# Patient Record
Sex: Male | Born: 1960 | Race: Black or African American | Hispanic: No | Marital: Single | Smoking: Current every day smoker
Health system: Southern US, Community
[De-identification: ages and names within clinical notes are randomized; demographics above are authoritative.]

## PROBLEM LIST (undated history)

## (undated) HISTORY — PX: BACK SURGERY: SHX140

---

## 2019-10-13 DIAGNOSIS — L0591 Pilonidal cyst without abscess: Secondary | ICD-10-CM | POA: Insufficient documentation

## 2019-10-13 HISTORY — DX: Pilonidal cyst without abscess: L05.91

## 2019-11-09 DIAGNOSIS — Z09 Encounter for follow-up examination after completed treatment for conditions other than malignant neoplasm: Secondary | ICD-10-CM

## 2019-11-09 HISTORY — DX: Encounter for follow-up examination after completed treatment for conditions other than malignant neoplasm: Z09

## 2019-12-02 DIAGNOSIS — E782 Mixed hyperlipidemia: Secondary | ICD-10-CM

## 2019-12-02 DIAGNOSIS — I10 Essential (primary) hypertension: Secondary | ICD-10-CM

## 2019-12-02 DIAGNOSIS — E119 Type 2 diabetes mellitus without complications: Secondary | ICD-10-CM | POA: Insufficient documentation

## 2019-12-02 HISTORY — DX: Mixed hyperlipidemia: E78.2

## 2019-12-02 HISTORY — DX: Essential (primary) hypertension: I10

## 2019-12-02 HISTORY — DX: Type 2 diabetes mellitus without complications: E11.9

## 2019-12-05 DIAGNOSIS — N401 Enlarged prostate with lower urinary tract symptoms: Secondary | ICD-10-CM

## 2019-12-05 DIAGNOSIS — F339 Major depressive disorder, recurrent, unspecified: Secondary | ICD-10-CM

## 2019-12-05 DIAGNOSIS — J45990 Exercise induced bronchospasm: Secondary | ICD-10-CM | POA: Insufficient documentation

## 2019-12-05 HISTORY — DX: Major depressive disorder, recurrent, unspecified: F33.9

## 2019-12-05 HISTORY — DX: Exercise induced bronchospasm: J45.990

## 2019-12-05 HISTORY — DX: Benign prostatic hyperplasia with lower urinary tract symptoms: N40.1

## 2020-01-06 DIAGNOSIS — G43909 Migraine, unspecified, not intractable, without status migrainosus: Secondary | ICD-10-CM | POA: Insufficient documentation

## 2020-01-06 DIAGNOSIS — H903 Sensorineural hearing loss, bilateral: Secondary | ICD-10-CM | POA: Insufficient documentation

## 2020-01-06 DIAGNOSIS — R972 Elevated prostate specific antigen [PSA]: Secondary | ICD-10-CM | POA: Insufficient documentation

## 2020-01-06 DIAGNOSIS — A630 Anogenital (venereal) warts: Secondary | ICD-10-CM | POA: Insufficient documentation

## 2020-01-06 DIAGNOSIS — Z8659 Personal history of other mental and behavioral disorders: Secondary | ICD-10-CM | POA: Insufficient documentation

## 2020-01-06 HISTORY — DX: Anogenital (venereal) warts: A63.0

## 2020-01-06 HISTORY — DX: Migraine, unspecified, not intractable, without status migrainosus: G43.909

## 2020-01-06 HISTORY — DX: Sensorineural hearing loss, bilateral: H90.3

## 2020-01-06 HISTORY — DX: Elevated prostate specific antigen (PSA): R97.20

## 2020-03-02 DIAGNOSIS — J039 Acute tonsillitis, unspecified: Secondary | ICD-10-CM | POA: Insufficient documentation

## 2020-03-02 HISTORY — DX: Acute tonsillitis, unspecified: J03.90

## 2020-05-11 DIAGNOSIS — M47812 Spondylosis without myelopathy or radiculopathy, cervical region: Secondary | ICD-10-CM | POA: Insufficient documentation

## 2020-05-11 DIAGNOSIS — R29898 Other symptoms and signs involving the musculoskeletal system: Secondary | ICD-10-CM | POA: Insufficient documentation

## 2020-05-11 DIAGNOSIS — G5602 Carpal tunnel syndrome, left upper limb: Secondary | ICD-10-CM

## 2020-05-11 DIAGNOSIS — M5030 Other cervical disc degeneration, unspecified cervical region: Secondary | ICD-10-CM | POA: Insufficient documentation

## 2020-05-11 DIAGNOSIS — G5621 Lesion of ulnar nerve, right upper limb: Secondary | ICD-10-CM

## 2020-05-11 HISTORY — DX: Lesion of ulnar nerve, right upper limb: G56.21

## 2020-05-11 HISTORY — DX: Carpal tunnel syndrome, left upper limb: G56.02

## 2020-05-11 HISTORY — DX: Spondylosis without myelopathy or radiculopathy, cervical region: M47.812

## 2020-05-11 HISTORY — DX: Other symptoms and signs involving the musculoskeletal system: R29.898

## 2020-05-11 HISTORY — DX: Other cervical disc degeneration, unspecified cervical region: M50.30

## 2020-05-24 DIAGNOSIS — R079 Chest pain, unspecified: Secondary | ICD-10-CM | POA: Diagnosis not present

## 2020-05-25 DIAGNOSIS — E785 Hyperlipidemia, unspecified: Secondary | ICD-10-CM

## 2020-05-25 DIAGNOSIS — R9431 Abnormal electrocardiogram [ECG] [EKG]: Secondary | ICD-10-CM

## 2020-05-25 DIAGNOSIS — F17200 Nicotine dependence, unspecified, uncomplicated: Secondary | ICD-10-CM | POA: Diagnosis not present

## 2020-05-25 DIAGNOSIS — I10 Essential (primary) hypertension: Secondary | ICD-10-CM

## 2020-05-25 DIAGNOSIS — R079 Chest pain, unspecified: Secondary | ICD-10-CM | POA: Diagnosis not present

## 2020-05-26 ENCOUNTER — Encounter: Payer: Self-pay | Admitting: Cardiology

## 2020-05-26 DIAGNOSIS — F17200 Nicotine dependence, unspecified, uncomplicated: Secondary | ICD-10-CM

## 2020-05-26 DIAGNOSIS — R079 Chest pain, unspecified: Secondary | ICD-10-CM | POA: Diagnosis not present

## 2020-05-26 DIAGNOSIS — I10 Essential (primary) hypertension: Secondary | ICD-10-CM | POA: Diagnosis not present

## 2020-05-26 DIAGNOSIS — E785 Hyperlipidemia, unspecified: Secondary | ICD-10-CM

## 2020-05-30 ENCOUNTER — Ambulatory Visit: Payer: Self-pay | Admitting: Cardiology

## 2020-06-15 DIAGNOSIS — R399 Unspecified symptoms and signs involving the genitourinary system: Secondary | ICD-10-CM | POA: Insufficient documentation

## 2020-06-15 HISTORY — DX: Unspecified symptoms and signs involving the genitourinary system: R39.9

## 2020-06-25 ENCOUNTER — Ambulatory Visit (INDEPENDENT_AMBULATORY_CARE_PROVIDER_SITE_OTHER): Payer: Medicaid Other | Admitting: Cardiology

## 2020-06-25 ENCOUNTER — Encounter: Payer: Self-pay | Admitting: Cardiology

## 2020-06-25 ENCOUNTER — Other Ambulatory Visit: Payer: Self-pay

## 2020-06-25 VITALS — BP 134/80 | HR 89 | Ht 72.0 in | Wt 239.8 lb

## 2020-06-25 DIAGNOSIS — E119 Type 2 diabetes mellitus without complications: Secondary | ICD-10-CM

## 2020-06-25 DIAGNOSIS — R072 Precordial pain: Secondary | ICD-10-CM | POA: Diagnosis not present

## 2020-06-25 DIAGNOSIS — E782 Mixed hyperlipidemia: Secondary | ICD-10-CM

## 2020-06-25 DIAGNOSIS — E669 Obesity, unspecified: Secondary | ICD-10-CM

## 2020-06-25 DIAGNOSIS — I10 Essential (primary) hypertension: Secondary | ICD-10-CM

## 2020-06-25 HISTORY — DX: Precordial pain: R07.2

## 2020-06-25 HISTORY — DX: Obesity, unspecified: E66.9

## 2020-06-25 MED ORDER — METOPROLOL TARTRATE 100 MG PO TABS: 100.0000 mg | ORAL_TABLET | Freq: Once | ORAL | 0 refills | Status: AC

## 2020-06-25 NOTE — Progress Notes (Signed)
Cardiology Office Note:    Date:  06/25/2020   ID:  David Mason, DOB 22-May-1961, MRN [id number]  PCP:  Algis Greenhouse, MD  Cardiologist:  Berniece Salines, DO  Electrophysiologist:  None   Referring MD: Algis Greenhouse, MD   Hospital follow-up  History of Present Illness:    David Mason is a 59 y.o. male with a hx of *hypertension, diabetes, hyperlipidemia presents today to be evaluated for chest pain.  Patient tells me that since his discharge from the hospital where he had a negative stress test he has still been experiencing intermittent chest pain.  He notes that this is getting worse.  He described this as a midsternal chest discomfort which is sometimes occurs as a tightness.  Was bothering him is the worsening shortness of breath on exertion.  He notes that he feels short of breath when he is experiencing his pain.  Nothing makes it better or worse.  He is concerned.  History reviewed. No pertinent past medical history.  Past Surgical History:  Procedure Laterality Date  . BACK SURGERY      Current Medications: Current Meds  Medication Sig  . albuterol (VENTOLIN HFA) 108 (90 Base) MCG/ACT inhaler Inhale 2 puffs into the lungs every 6 (six) hours as needed.  Marland Kitchen amLODipine (NORVASC) 10 MG tablet Take 10 mg by mouth daily.  Marland Kitchen aspirin 81 MG EC tablet Take 81 mg by mouth daily.  Marland Kitchen atorvastatin (LIPITOR) 80 MG tablet Take 80 mg by mouth daily.  . DULoxetine (CYMBALTA) 60 MG capsule Take 60 mg by mouth 2 (two) times daily.  . DUREZOL 0.05 % EMUL Place 1 drop into the right eye in the morning.  Marland Kitchen FARXIGA 10 MG TABS tablet Take 10 mg by mouth daily.  . finasteride (PROSCAR) 5 MG tablet Take 5 mg by mouth daily.  . metFORMIN (GLUCOPHAGE) 1000 MG tablet Take 1,000 mg by mouth 2 (two) times daily.  . montelukast (SINGULAIR) 10 MG tablet Take 10 mg by mouth daily.  Marland Kitchen ofloxacin (OCUFLOX) 0.3 % ophthalmic solution Place 1 drop into the right eye 2 (two) times daily.  Marland Kitchen OLANZapine  (ZYPREXA) 5 MG tablet Take 5 mg by mouth at bedtime.  Marland Kitchen tiZANidine (ZANAFLEX) 4 MG capsule Take 4 mg by mouth every 6 (six) hours as needed.  . traZODone (DESYREL) 100 MG tablet Take 100 mg by mouth at bedtime.  . valsartan (DIOVAN) 320 MG tablet Take 320 mg by mouth daily.     Allergies:   Patient has no known allergies.   Social History   Socioeconomic History  . Marital status: Single    Spouse name: Not on file  . Number of children: Not on file  . Years of education: Not on file  . Highest education level: Not on file  Occupational History  . Not on file  Tobacco Use  . Smoking status: Not on file  Substance and Sexual Activity  . Alcohol use: Not on file  . Drug use: Not on file  . Sexual activity: Not on file  Other Topics Concern  . Not on file  Social History Narrative  . Not on file   Social Determinants of Health   Financial Resource Strain:   . Difficulty of Paying Living Expenses: Not on file  Food Insecurity:   . Worried About Charity fundraiser in the Last Year: Not on file  . Ran Out of Food in the Last Year: Not on file  Transportation  Needs:   . Lack of Transportation (Medical): Not on file  . Lack of Transportation (Non-Medical): Not on file  Physical Activity:   . Days of Exercise per Week: Not on file  . Minutes of Exercise per Session: Not on file  Stress:   . Feeling of Stress : Not on file  Social Connections:   . Frequency of Communication with Friends and Family: Not on file  . Frequency of Social Gatherings with Friends and Family: Not on file  . Attends Religious Services: Not on file  . Active Member of Clubs or Organizations: Not on file  . Attends Archivist Meetings: Not on file  . Marital Status: Not on file     Family History: The patient's family history includes Hypertension in his brother, brother, and brother.  ROS:   Review of Systems  Constitution: Negative for decreased appetite, fever and weight gain.    HENT: Negative for congestion, ear discharge, hoarse voice and sore throat.   Eyes: Negative for discharge, redness, vision loss in right eye and visual halos.  Cardiovascular: Reports chest pain, dyspnea on exertion.  Negative for leg swelling, orthopnea and palpitations.  Respiratory: Negative for cough, hemoptysis, shortness of breath and snoring.   Endocrine: Negative for heat intolerance and polyphagia.  Hematologic/Lymphatic: Negative for bleeding problem. Does not bruise/bleed easily.  Skin: Negative for flushing, nail changes, rash and suspicious lesions.  Musculoskeletal: Negative for arthritis, joint pain, muscle cramps, myalgias, neck pain and stiffness.  Gastrointestinal: Negative for abdominal pain, bowel incontinence, diarrhea and excessive appetite.  Genitourinary: Negative for decreased libido, genital sores and incomplete emptying.  Neurological: Negative for brief paralysis, focal weakness, headaches and loss of balance.  Psychiatric/Behavioral: Negative for altered mental status, depression and suicidal ideas.  Allergic/Immunologic: Negative for HIV exposure and persistent infections.    EKGs/Labs/Other Studies Reviewed:    The following studies were reviewed today:   EKG: None today  Stress test done at [facility] on May 26, 2020 shows no inducible ischemia or infarction.  Normal EF 56% low risk study.  Echocardiogram done showed mild left ventricle hypertrophy.  EF 55 to 60%.  Right ventricle is normal in size and function.  Left atrium is normal.  Right atrium is normal size and function.  Aortic valve is trileaflet.  There is trace mitral regurgitation.  Trace tricuspid regurgitation is present.  Pulmonic valve is normal.  Recent Labs: No results found for requested labs within last 8760 hours.  Recent Lipid Panel No results found for: CHOL, TRIG, HDL, CHOLHDL, VLDL, LDLCALC, LDLDIRECT  Physical Exam:    VS:  BP 134/80   Pulse 89   Ht 6' (1.829 m)    Wt 239 lb 12.8 oz (108.8 kg)   SpO2 94%   BMI 32.52 kg/m     Wt Readings from Last 3 Encounters:  06/25/20 239 lb 12.8 oz (108.8 kg)     GEN: Well nourished, well developed in no acute distress HEENT: Normal NECK: No JVD; No carotid bruits LYMPHATICS: No lymphadenopathy CARDIAC: S1S2 noted,RRR, no murmurs, rubs, gallops RESPIRATORY:  Clear to auscultation without rales, wheezing or rhonchi  ABDOMEN: Soft, non-tender, non-distended, +bowel sounds, no guarding. EXTREMITIES: No edema, No cyanosis, no clubbing MUSCULOSKELETAL:  No deformity  SKIN: Warm and dry NEUROLOGIC:  Alert and oriented x 3, non-focal PSYCHIATRIC:  Normal affect, good insight  ASSESSMENT:    1. Benign hypertension   2. Precordial pain   3. Type 2 diabetes mellitus without complication,  without long-term current use of insulin ([place])   4. Mixed hyperlipidemia   5. Obesity (BMI 30-39.9)    PLAN:     He still is experiencing chest pain which is concerning.  Despite the negative stress test I am concerned given his risk factors and I like to use a different diagnostic testing modality to test this patient for coronary artery disease.  A coronary CTA would be reasonable at this time.  I have educated patient about this testing he is agreeable to proceed with this testing.  His blood pressure deceptively in the office today no changes will be made to his antihypertensive medication. Diabetes mellitus per his PCP. Hyperlipidemia continue patient on his atorvastatin 80 mg a day. The patient understands the need to lose weight with diet and exercise. We have discussed specific strategies for this.  The patient is in agreement with the above plan. The patient left the office in stable condition.  The patient will follow up in 3 months or sooner if needed.   Medication Adjustments/Labs and Tests Ordered: Current medicines are reviewed at length with the patient today.  Concerns regarding medicines are outlined  above.  Orders Placed This Encounter  Procedures  . CT CORONARY MORPH W/CTA COR W/SCORE W/CA W/CM &/OR WO/CM  . CT CORONARY FRACTIONAL FLOW RESERVE DATA PREP  . CT CORONARY FRACTIONAL FLOW RESERVE FLUID ANALYSIS  . Basic Metabolic Panel (BMET)  . EKG 12-Lead   Meds ordered this encounter  Medications  . metoprolol tartrate (LOPRESSOR) 100 MG tablet    Sig: Take 1 tablet (100 mg total) by mouth once for 1 dose. Take 2 hours prior to procedure    Dispense:  1 tablet    Refill:  0    Patient Instructions  Medication Instructions:   Your physician recommends that you continue on your current medications as directed. Please refer to the Current Medication list given to you today.   *If you need a refill on your cardiac medications before your next appointment, please call your pharmacy*   Lab Work:  RETURN 4-5 [place]  If you have labs (blood work) drawn today and your tests are completely normal, you will receive your results only by: Marland Kitchen MyChart Message (if you have MyChart) OR . A paper copy in the mail If you have any lab test that is abnormal or we need to change your treatment, we will call you to review the results.   Testing/Procedures:Non-Cardiac CT Angiography (CTA), is a special type of CT scan that uses a computer to produce multi-dimensional views of major blood vessels throughout the body. In CT angiography, a contrast material is injected through an IV to help visualize the blood vessels  Follow-Up: At [facility], you and your health needs are our priority.  As part of our continuing mission to provide you with exceptional heart care, we have created designated Provider Care Teams.  These Care Teams include your primary Cardiologist (physician) and Advanced Practice Providers (APPs -  Physician Assistants and Nurse Practitioners) who all work together to provide you with the care you need, when you need it.  We recommend signing up for the patient  portal called "MyChart".  Sign up information is provided on this After Visit Summary.  MyChart is used to connect with patients for Virtual Visits (Telemedicine).  Patients are able to view lab/test results, encounter notes, upcoming appointments, etc.  Non-urgent messages can be sent to your provider as well.   To learn  more about what you can do with MyChart, go to [email or web address].    Your next appointment:   2 month(s)  The format for your next appointment:   In Person  Provider:   You will see Dr.Shoichi Mielke.  Or, you can be scheduled with the following Advanced Practice Provider on your designated Care Team (at our [facility]):  Laurann Montana, FNP     Other Instructions   Your cardiac CT will be scheduled at one of the below locations:   [facility] [address] [place], [place] [address] [phone]  [employer] [address] [place], [place] [address] [phone]  If scheduled at [facility], please arrive at the [facility] main entrance of [facility] 30 minutes prior to test start time. Proceed to the [facility] Radiology Department (first floor) to check-in and test prep.  If scheduled at [facility], please arrive 15 mins early for check-in and test prep.  Please follow these instructions carefully (unless otherwise directed):  Hold all erectile dysfunction medications at least 3 days (72 hrs) prior to test.  On the Night Before the Test: . Be sure to Drink plenty of water. . Do not consume any caffeinated/decaffeinated beverages or chocolate 12 hours prior to your test. . Do not take any antihistamines 12 hours prior to your test. . If the patient has contrast allergy:  On the Day of the Test: . Drink plenty of water. Do not drink any water within one hour of the test. . Do not eat any food 4 hours prior to the test. . You may take  your regular medications prior to the test.  . Take metoprolol (Lopressor) two hours prior to test. . HOLD Furosemide/Hydrochlorothiazide morning of the test.   *For Clinical Staff only. Please instruct patient the following:*        -Drink plenty of water       -Hold Furosemide/hydrochlorothiazide morning of the test       -Take metoprolol (Lopressor) 2 hours prior to test (if applicable).                  -If HR is less than 55 BPM- No Lopressor                -IF HR is greater than 55 BPM and patient is less than or equal to 65 yrs old Lopressor 124m x1.                -If HR is greater than 55 BPM and patient is greater than 758yrs old Lopressor 50 mg x1.     Do not give Lopressor to patients with an allergy to lopressor or anyone with asthma or active COPD symptoms (currently taking steroids).       After the Test: . Drink plenty of water. . After receiving IV contrast, you may experience a mild flushed feeling. This is normal. . On occasion, you may experience a mild rash up to 24 hours after the test. This is not dangerous. If this occurs, you can take Benadryl 25 mg and increase your fluid intake. . If you experience trouble breathing, this can be serious. If it is severe call 911 IMMEDIATELY. If it is mild, please call our office. . If you take any of these medications: Glipizide/Metformin, Avandament, Glucavance, please do not take 48 hours after completing test unless otherwise instructed.   Once we  have confirmed authorization from your insurance company, we will call you to set up a date and time for your test. Based on how quickly your insurance processes prior authorizations requests, please allow up to 4 weeks to be contacted for scheduling your Cardiac CT appointment. Be advised that routine Cardiac CT appointments could be scheduled as many as 8 weeks after your provider has ordered it.  For non-scheduling related questions, please contact the cardiac imaging nurse  navigator should you have any questions/concerns: Marchia Bond, Cardiac Imaging Nurse Navigator Burley Saver, Interim Cardiac Imaging Nurse [place] and Vascular Services Direct Office Dial: [phone]   For scheduling needs, including cancellations and rescheduling, please call Vivien Rota at [phone], option 3.       Adopting a Healthy Lifestyle.  Know what a healthy weight is for you (roughly BMI <25) and aim to maintain this   Aim for 7+ servings of fruits and vegetables daily   65-80+ fluid ounces of water or unsweet tea for healthy kidneys   Limit to max 1 drink of alcohol per day; avoid smoking/tobacco   Limit animal fats in diet for cholesterol and heart health - choose grass fed whenever available   Avoid highly processed foods, and foods high in saturated/trans fats   Aim for low stress - take time to unwind and care for your mental health   Aim for 150 min of moderate intensity exercise weekly for heart health, and weights twice weekly for bone health   Aim for 7-9 hours of sleep daily   When it comes to diets, agreement about the perfect plan isnt easy to find, even among the experts. Experts at the [employer] developed an idea known as the Healthy Eating Plate. Just imagine a plate divided into logical, healthy portions.   The emphasis is on diet quality:   Load up on vegetables and fruits - one-half of your plate: Aim for color and variety, and remember that potatoes dont count.   Go for whole grains - one-quarter of your plate: Whole wheat, barley, wheat berries, quinoa, oats, brown rice, and foods made with them. If you want pasta, go with whole wheat pasta.   Protein power - one-quarter of your plate: Fish, chicken, beans, and nuts are all healthy, versatile protein sources. Limit red meat.   The diet, however, does go beyond the plate, offering a few other suggestions.   Use healthy plant oils, such as olive, canola,  soy, corn, sunflower and peanut. Check the labels, and avoid partially hydrogenated oil, which have unhealthy trans fats.   If youre thirsty, drink water. Coffee and tea are good in moderation, but skip sugary drinks and limit milk and dairy products to one or two daily servings.   The type of carbohydrate in the diet is more important than the amount. Some sources of carbohydrates, such as vegetables, fruits, whole grains, and beans-are healthier than others.   Finally, stay active  Signed, Berniece Salines, DO  06/25/2020 5:22 PM    [facility]

## 2020-06-25 NOTE — Patient Instructions (Signed)
Medication Instructions:   Your physician recommends that you continue on your current medications as directed. Please refer to the Current Medication list given to you today.   *If you need a refill on your cardiac medications before your next appointment, please call your pharmacy*   Lab Work:  RETURN 4-5 DAYS BEFORE PROCEDURE  If you have labs (blood work) drawn today and your tests are completely normal, you will receive your results only by:  [place] (if you have MyChart) OR  A paper copy in the mail If you have any lab test that is abnormal or we need to change your treatment, we will call you to review the results.   Testing/Procedures:Non-Cardiac CT Angiography (CTA), is a special type of CT scan that uses a computer to produce multi-dimensional views of major blood vessels throughout the body. In CT angiography, a contrast material is injected through an IV to help visualize the blood vessels  Follow-Up: At [facility], you and your health needs are our priority.  As part of our continuing mission to provide you with exceptional heart care, we have created designated Provider Care Teams.  These Care Teams include your primary Cardiologist (physician) and Advanced Practice Providers (APPs -  Physician Assistants and Nurse Practitioners) who all work together to provide you with the care you need, when you need it.  We recommend signing up for the patient portal called "MyChart".  Sign up information is provided on this After Visit Summary.  MyChart is used to connect with patients for Virtual Visits (Telemedicine).  Patients are able to view lab/test results, encounter notes, upcoming appointments, etc.  Non-urgent messages can be sent to your provider as well.   To learn more about what you can do with MyChart, go to [email or web address].    Your next appointment:   2 month(s)  The format for your next appointment:   In Person  Provider:   You will see Dr.Tobb.   Or, you can be scheduled with the following Advanced Practice Provider on your designated Care Team (at our [facility]):  Laurann Montana, FNP     Other Instructions   Your cardiac CT will be scheduled at one of the below locations:   [facility] [address] [place], [place] [address] [phone]  [place] [address] [place], [place] [address] [phone]  If scheduled at [facility], please arrive at the [facility] main entrance of [facility] 30 minutes prior to test start time. Proceed to the [facility] Radiology Department (first floor) to check-in and test prep.  If scheduled at [facility], please arrive 15 mins early for check-in and test prep.  Please follow these instructions carefully (unless otherwise directed):  Hold all erectile dysfunction medications at least 3 days (72 hrs) prior to test.  On the Night Before the Test:  Be sure to Drink plenty of water.  Do not consume any caffeinated/decaffeinated beverages or chocolate 12 hours prior to your test.  Do not take any antihistamines 12 hours prior to your test.  If the patient has contrast allergy:  On the Day of the Test:  Drink plenty of water. Do not drink any water within one hour of the test.  Do not eat any food 4 hours prior to the test.  You may take your regular medications prior to the test.   Take metoprolol (Lopressor) two hours prior to  test.  HOLD Furosemide/Hydrochlorothiazide morning of the test.   *For Clinical Staff only. Please instruct patient the following:*        -Drink plenty of water       -Hold Furosemide/hydrochlorothiazide morning of the test       -Take metoprolol (Lopressor) 2 hours prior to test (if applicable).                  -If HR is less than 55 BPM- No Lopressor                -IF HR is greater than 55 BPM and patient is less than  or equal to 45 yrs old Lopressor 161m x1.                -If HR is greater than 55 BPM and patient is greater than 751yrs old Lopressor 50 mg x1.     Do not give Lopressor to patients with an allergy to lopressor or anyone with asthma or active COPD symptoms (currently taking steroids).       After the Test:  Drink plenty of water.  After receiving IV contrast, you may experience a mild flushed feeling. This is normal.  On occasion, you may experience a mild rash up to 24 hours after the test. This is not dangerous. If this occurs, you can take Benadryl 25 mg and increase your fluid intake.  If you experience trouble breathing, this can be serious. If it is severe call 911 IMMEDIATELY. If it is mild, please call our office.  If you take any of these medications: Glipizide/Metformin, Avandament, Glucavance, please do not take 48 hours after completing test unless otherwise instructed.   Once we have confirmed authorization from your insurance company, we will call you to set up a date and time for your test. Based on how quickly your insurance processes prior authorizations requests, please allow up to 4 weeks to be contacted for scheduling your Cardiac CT appointment. Be advised that routine Cardiac CT appointments could be scheduled as many as 8 weeks after your provider has ordered it.  For non-scheduling related questions, please contact the cardiac imaging nurse navigator should you have any questions/concerns: SMarchia Bond Cardiac Imaging Nurse Navigator MBurley Saver Interim Cardiac Imaging Nurse [place] [facility] Direct Office Dial: [phone]  For scheduling needs, including cancellations and rescheduling, please call TVivien Rotaat [phone] option 3.

## 2020-07-17 DIAGNOSIS — L0293 Carbuncle, unspecified: Secondary | ICD-10-CM

## 2020-07-17 HISTORY — DX: Carbuncle, unspecified: L02.93

## 2020-07-21 LAB — BASIC METABOLIC PANEL
BUN/Creatinine Ratio: 6 — ABNORMAL LOW (ref 10–24)
BUN: 9 mg/dL (ref 8–27)
CO2: 23 mmol/L (ref 20–29)
Calcium: 9.3 mg/dL (ref 8.6–10.2)
Chloride: 105 mmol/L (ref 96–106)
Creatinine, Ser: 1.49 mg/dL — ABNORMAL HIGH (ref 0.76–1.27)
GFR calc Af Amer: 58 mL/min/{1.73_m2} — ABNORMAL LOW (ref >59)
GFR calc non Af Amer: 50 mL/min/{1.73_m2} — ABNORMAL LOW (ref >59)
Glucose: 203 mg/dL — ABNORMAL HIGH (ref 65–99)
Potassium: 4.5 mmol/L (ref 3.5–5.2)
Sodium: 143 mmol/L (ref 134–144)

## 2020-07-23 ENCOUNTER — Telehealth: Payer: Self-pay

## 2020-07-23 NOTE — Telephone Encounter (Signed)
-----   Message from Thomasene Ripple, DO sent at 07/23/2020  1:51 PM EDT ----- Creatinine is elevated, I recommend increasing your fluid intake days before your CT scan and also after your CT scan continue to increase your fluid intake.

## 2020-07-23 NOTE — Telephone Encounter (Signed)
Patient was returning phone call about results 

## 2020-07-23 NOTE — Telephone Encounter (Signed)
Spoke with patient regarding results and recommendation.  Patient verbalizes understanding and is agreeable to plan of care. Advised patient to call back with any issues or concerns.  

## 2020-07-23 NOTE — Telephone Encounter (Signed)
Left message on patients voicemail to please return our call.   

## 2020-07-27 ENCOUNTER — Telehealth (HOSPITAL_COMMUNITY): Payer: Self-pay | Admitting: Emergency Medicine

## 2020-07-27 NOTE — Telephone Encounter (Signed)
Reaching out to patient to offer assistance regarding upcoming cardiac imaging study; pt verbalizes understanding of appt date/time, parking situation and where to check in, pre-test NPO status and medications ordered, and verified current allergies; name and call back number provided for further questions should they arise [place] RN Navigator Cardiac Imaging [facility] [phone] office [phone] cell   Pt encouraged to drink plenty of water over the weekend and not to take metformin d/t kidney labs  [place]

## 2020-07-30 ENCOUNTER — Ambulatory Visit (HOSPITAL_COMMUNITY)
Admission: RE | Admit: 2020-07-30 | Discharge: 2020-07-30 | Disposition: A | Discharge status: Home / Self Care | Payer: Medicaid Other | Source: Ambulatory Visit | Attending: Cardiology | Admitting: Cardiology

## 2020-07-30 ENCOUNTER — Other Ambulatory Visit: Payer: Self-pay

## 2020-07-30 DIAGNOSIS — R072 Precordial pain: Secondary | ICD-10-CM | POA: Diagnosis not present

## 2020-07-30 MED ORDER — METOPROLOL TARTRATE 5 MG/5ML IV SOLN: 5.0000 mg | INTRAVENOUS | Status: DC | PRN

## 2020-07-30 MED ORDER — NITROGLYCERIN 0.4 MG SL SUBL
0.8000 mg | SUBLINGUAL_TABLET | Freq: Once | SUBLINGUAL | Status: AC
  Administered 2020-07-30: 0.8 mg via SUBLINGUAL

## 2020-07-30 MED ORDER — NITROGLYCERIN 0.4 MG SL SUBL
SUBLINGUAL_TABLET | SUBLINGUAL | Status: AC
  Filled 2020-07-30: qty 2

## 2020-07-30 MED ORDER — IOHEXOL 350 MG/ML SOLN
80.0000 mL | Freq: Once | INTRAVENOUS | Status: AC | PRN
  Administered 2020-07-30: 80 mL via INTRAVENOUS

## 2020-07-30 NOTE — Progress Notes (Signed)
CT completed. Tolerated well. D/C home ambulatory, awake and alert. In no distress

## 2020-07-31 ENCOUNTER — Telehealth: Payer: Self-pay

## 2020-07-31 NOTE — Telephone Encounter (Signed)
-----   Message from Thomasene Ripple, DO sent at 07/31/2020  4:47 PM EDT ----- Very minimal coronary artery disease.  We will continue medical management.

## 2020-07-31 NOTE — Telephone Encounter (Signed)
Spoke with patient regarding results and recommendation.  Patient verbalizes understanding and is agreeable to plan of care. Advised patient to call back with any issues or concerns.  

## 2020-08-30 ENCOUNTER — Encounter: Payer: Self-pay | Admitting: Cardiology

## 2020-08-30 ENCOUNTER — Other Ambulatory Visit: Payer: Self-pay

## 2020-08-30 ENCOUNTER — Ambulatory Visit (INDEPENDENT_AMBULATORY_CARE_PROVIDER_SITE_OTHER): Payer: Medicaid Other | Admitting: Cardiology

## 2020-08-30 ENCOUNTER — Ambulatory Visit: Payer: Medicaid Other | Admitting: Cardiology

## 2020-08-30 VITALS — BP 136/88 | HR 70 | Ht 72.0 in | Wt 254.0 lb

## 2020-08-30 DIAGNOSIS — I10 Essential (primary) hypertension: Secondary | ICD-10-CM

## 2020-08-30 DIAGNOSIS — I2510 Atherosclerotic heart disease of native coronary artery without angina pectoris: Secondary | ICD-10-CM

## 2020-08-30 DIAGNOSIS — E1100 Type 2 diabetes mellitus with hyperosmolarity without nonketotic hyperglycemic-hyperosmolar coma (NKHHC): Secondary | ICD-10-CM

## 2020-08-30 DIAGNOSIS — E669 Obesity, unspecified: Secondary | ICD-10-CM

## 2020-08-30 DIAGNOSIS — R0789 Other chest pain: Secondary | ICD-10-CM | POA: Diagnosis not present

## 2020-08-30 NOTE — Progress Notes (Signed)
Cardiology Office Note:    Date:  08/30/2020   ID:  David Mason, DOB 1960/11/18, MRN [id number]  PCP:  Olive Bass, MD  Cardiologist:  Thomasene Ripple, DO  Electrophysiologist:  None   Referring MD: Olive Bass, MD   "I am doing   History of Present Illness:    David Mason is a 59 y.o. male with a hx of minimal nonobstructive coronary artery disease, hypertension, hyperlipidemia, diabetes presents today for follow-up visit.  I saw the patient back in August 2021 at that time he had had a nuclear stress test at [facility] which was normal.  He still complained of significant pain given his risk factors and I send the patient for a coronary CTA.   His CTA did show evidence of minimal nonobstructive coronary artery disease.  He is here today for follow-up visit.  He tells me he has been experiencing some chest wall pains.  He notes that usually he feels this when he is doing his push-ups.  He has not had any trial of NSAIDs or Tylenol.  Past Medical History:  Diagnosis Date  . Asthma, exercise induced 12/05/2019  . Benign hypertension 12/02/2019  . Bilateral arm weakness 05/11/2020  . Carbuncle 07/17/2020   Formatting of this note might be different from the original. 07/17/2020 : upper abd  . Carpal tunnel syndrome, left 05/11/2020  . Cervical spondylosis 05/11/2020  . Chronic recurrent pilonidal cyst 10/13/2019  . Condyloma acuminata 01/06/2020   Formatting of this note might be different from the original. 2013  . DDD (degenerative disc disease), cervical 05/11/2020  . Depression, major, recurrent (HCC) 12/05/2019  . Elevated PSA 01/06/2020   Formatting of this note might be different from the original. 2019: eval UROL, MRI reported normal  . Enlarged prostate with lower urinary tract symptoms (LUTS) 12/05/2019  . Lower urinary tract symptoms (LUTS) 06/15/2020   Formatting of this note might be different from the original. 2021  . Migraine headache 01/06/2020  . Mixed hyperlipidemia  12/02/2019  . Obesity (BMI 30-39.9) 06/25/2020  . Postoperative examination 11/09/2019  . Precordial pain 06/25/2020  . Sensorineural hearing loss (SNHL) of both ears 01/06/2020  . Tonsillitis 03/02/2020   Formatting of this note might be different from the original. 2021  . Type 2 diabetes mellitus without complication, without long-term current use of insulin (HCC) 12/02/2019  . Ulnar neuropathy at elbow, right 05/11/2020    Past Surgical History:  Procedure Laterality Date  . BACK SURGERY      Current Medications: Current Meds  Medication Sig  . albuterol (VENTOLIN HFA) 108 (90 Base) MCG/ACT inhaler Inhale 2 puffs into the lungs every 6 (six) hours as needed.  Marland Kitchen amLODipine (NORVASC) 10 MG tablet Take 10 mg by mouth daily.  Marland Kitchen aspirin 81 MG EC tablet Take 81 mg by mouth daily.  Marland Kitchen atorvastatin (LIPITOR) 80 MG tablet Take 80 mg by mouth daily.  . DULoxetine (CYMBALTA) 60 MG capsule Take 60 mg by mouth 2 (two) times daily.  . DUREZOL 0.05 % EMUL Place 1 drop into the right eye in the morning.  Marland Kitchen FARXIGA 10 MG TABS tablet Take 10 mg by mouth daily.  . finasteride (PROSCAR) 5 MG tablet Take 5 mg by mouth daily.   Marland Kitchen gabapentin (NEURONTIN) 300 MG capsule Take 1 capsule by mouth at bedtime.  . metFORMIN (GLUCOPHAGE) 1000 MG tablet Take 1,000 mg by mouth 2 (two) times daily.  . montelukast (SINGULAIR) 10 MG tablet Take 10 mg by  mouth daily.  Marland Kitchen ofloxacin (OCUFLOX) 0.3 % ophthalmic solution Place 1 drop into the right eye 2 (two) times daily.  Marland Kitchen OLANZapine (ZYPREXA) 5 MG tablet Take 5 mg by mouth at bedtime.  . sulfamethoxazole-trimethoprim (BACTRIM DS) 800-160 MG tablet Take 1 tablet by mouth 2 (two) times daily.  Marland Kitchen tiZANidine (ZANAFLEX) 4 MG capsule Take 4 mg by mouth every 6 (six) hours as needed.  . traZODone (DESYREL) 100 MG tablet Take 100 mg by mouth at bedtime.  . valsartan (DIOVAN) 320 MG tablet Take 320 mg by mouth daily.     Allergies:   Patient has no known allergies.   Social History     Socioeconomic History  . Marital status: Single    Spouse name: Not on file  . Number of children: Not on file  . Years of education: Not on file  . Highest education level: Not on file  Occupational History  . Not on file  Tobacco Use  . Smoking status: Current Every Day Smoker    Packs/day: 0.50    Types: Cigarettes  . Smokeless tobacco: Never Used  . Tobacco comment: 8  Cigarettes a day   Substance and Sexual Activity  . Alcohol use: Not on file  . Drug use: Not on file  . Sexual activity: Not on file  Other Topics Concern  . Not on file  Social History Narrative  . Not on file   Social Determinants of Health   Financial Resource Strain:   . Difficulty of Paying Living Expenses: Not on file  Food Insecurity:   . Worried About Programme researcher, broadcasting/film/video in the Last Year: Not on file  . Ran Out of Food in the Last Year: Not on file  Transportation Needs:   . Lack of Transportation (Medical): Not on file  . Lack of Transportation (Non-Medical): Not on file  Physical Activity:   . Days of Exercise per Week: Not on file  . Minutes of Exercise per Session: Not on file  Stress:   . Feeling of Stress : Not on file  Social Connections:   . Frequency of Communication with Friends and Family: Not on file  . Frequency of Social Gatherings with Friends and Family: Not on file  . Attends Religious Services: Not on file  . Active Member of Clubs or Organizations: Not on file  . Attends Banker Meetings: Not on file  . Marital Status: Not on file     Family History: The patient's family history includes Hypertension in his brother, brother, and brother.  ROS:   Review of Systems  Constitution: Negative for decreased appetite, fever and weight gain.  HENT: Negative for congestion, ear discharge, hoarse voice and sore throat.   Eyes: Negative for discharge, redness, vision loss in right eye and visual halos.  Cardiovascular: Negative for chest pain, dyspnea on  exertion, leg swelling, orthopnea and palpitations.  Respiratory: Negative for cough, hemoptysis, shortness of breath and snoring.   Endocrine: Negative for heat intolerance and polyphagia.  Hematologic/Lymphatic: Negative for bleeding problem. Does not bruise/bleed easily.  Skin: Negative for flushing, nail changes, rash and suspicious lesions.  Musculoskeletal: Negative for arthritis, joint pain, muscle cramps, myalgias, neck pain and stiffness.  Gastrointestinal: Negative for abdominal pain, bowel incontinence, diarrhea and excessive appetite.  Genitourinary: Negative for decreased libido, genital sores and incomplete emptying.  Neurological: Negative for brief paralysis, focal weakness, headaches and loss of balance.  Psychiatric/Behavioral: Negative for altered mental status, depression and suicidal  ideas.  Allergic/Immunologic: Negative for HIV exposure and persistent infections.    EKGs/Labs/Other Studies Reviewed:    The following studies were reviewed today:   EKG: None today  Recent Labs: 07/20/2020: BUN 9; Creatinine, Ser 1.49; Potassium 4.5; Sodium 143  Recent Lipid Panel No results found for: CHOL, TRIG, HDL, CHOLHDL, VLDL, LDLCALC, LDLDIRECT  Physical Exam:    VS:  BP 136/88   Pulse 70   Ht 6' (1.829 m)   Wt 254 lb (115.2 kg)   SpO2 96%   BMI 34.45 kg/m     Wt Readings from Last 3 Encounters:  08/30/20 254 lb (115.2 kg)  06/25/20 239 lb 12.8 oz (108.8 kg)     GEN: Well nourished, well developed in no acute distress HEENT: Normal NECK: No JVD; No carotid bruits LYMPHATICS: No lymphadenopathy CARDIAC: S1S2 noted,RRR, no murmurs, rubs, gallops RESPIRATORY:  Clear to auscultation without rales, wheezing or rhonchi  ABDOMEN: Soft, non-tender, non-distended, +bowel sounds, no guarding. EXTREMITIES: No edema, No cyanosis, no clubbing MUSCULOSKELETAL:  No deformity  SKIN: Warm and dry NEUROLOGIC:  Alert and oriented x 3, non-focal PSYCHIATRIC:  Normal affect,  good insight  ASSESSMENT:    1. Costochondral chest pain   2. Coronary artery disease involving native coronary artery of native heart without angina pectoris   3. Benign hypertension   4. Type 2 diabetes mellitus with hyperosmolarity without coma, without long-term current use of insulin (HCC)   5. Obesity (BMI 30-39.9)    PLAN:    His chest pain does sound noncardiac and does sound more like costochondritis.  I have asked the patient to try Tylenol to see if this is going to help.   His coronary CTA does show that he does have minimal nonobstructive CAD.  I do not believe that this is causing his symptoms. Diabetes Per primary care doctor. The patient understands the need to lose weight with diet and exercise. We have discussed specific strategies for this.  The patient is in agreement with the above plan. The patient left the office in stable condition.  The patient will follow up in 12 months or sooner if needed.   Medication Adjustments/Labs and Tests Ordered: Current medicines are reviewed at length with the patient today.  Concerns regarding medicines are outlined above.  No orders of the defined types were placed in this encounter.  No orders of the defined types were placed in this encounter.   Patient Instructions  Medication Instructions:  Your physician recommends that you continue on your current medications as directed. Please refer to the Current Medication list given to you today.  *If you need a refill on your cardiac medications before your next appointment, please call your pharmacy*   Lab Work: None If you have labs (blood work) drawn today and your tests are completely normal, you will receive your results only by: Marland Kitchen. MyChart Message (if you have MyChart) OR . A paper copy in the mail If you have any lab test that is abnormal or we need to change your treatment, we will call you to review the results.   Testing/Procedures: None   Follow-Up: At [facility], you and your health needs are our priority.  As part of our continuing mission to provide you with exceptional heart care, we have created designated Provider Care Teams.  These Care Teams include your primary Cardiologist (physician) and Advanced Practice Providers (APPs -  Physician Assistants and Nurse Practitioners) who all work together to provide you with  the care you need, when you need it.  We recommend signing up for the patient portal called "MyChart".  Sign up information is provided on this After Visit Summary.  MyChart is used to connect with patients for Virtual Visits (Telemedicine).  Patients are able to view lab/test results, encounter notes, upcoming appointments, etc.  Non-urgent messages can be sent to your provider as well.   To learn more about what you can do with MyChart, go to [email or web address].    Your next appointment:   1 year(s)  The format for your next appointment:   In Person  Provider:   Thomasene Ripple, MD   Other Instructions      Adopting a Healthy Lifestyle.  Know what a healthy weight is for you (roughly BMI <25) and aim to maintain this   Aim for 7+ servings of fruits and vegetables daily   65-80+ fluid ounces of water or unsweet tea for healthy kidneys   Limit to max 1 drink of alcohol per day; avoid smoking/tobacco   Limit animal fats in diet for cholesterol and heart health - choose grass fed whenever available   Avoid highly processed foods, and foods high in saturated/trans fats   Aim for low stress - take time to unwind and care for your mental health   Aim for 150 min of moderate intensity exercise weekly for heart health, and weights twice weekly for bone health   Aim for 7-9 hours of sleep daily   When it comes to diets, agreement about the perfect plan isnt easy to find, even among the experts. Experts at the [facility] [employer] developed an idea known as the Healthy Eating Plate. Just imagine a plate  divided into logical, healthy portions.   The emphasis is on diet quality:   Load up on vegetables and fruits - one-half of your plate: Aim for color and variety, and remember that potatoes dont count.   Go for whole grains - one-quarter of your plate: Whole wheat, barley, wheat berries, quinoa, oats, brown rice, and foods made with them. If you want pasta, go with whole wheat pasta.   Protein power - one-quarter of your plate: Fish, chicken, beans, and nuts are all healthy, versatile protein sources. Limit red meat.   The diet, however, does go beyond the plate, offering a few other suggestions.   Use healthy plant oils, such as olive, canola, soy, corn, sunflower and peanut. Check the labels, and avoid partially hydrogenated oil, which have unhealthy trans fats.   If youre thirsty, drink water. Coffee and tea are good in moderation, but skip sugary drinks and limit milk and dairy products to one or two daily servings.   The type of carbohydrate in the diet is more important than the amount. Some sources of carbohydrates, such as vegetables, fruits, whole grains, and beans-are healthier than others.   Finally, stay active  Signed, Thomasene Ripple, DO  08/30/2020 2:38 PM    [facility]

## 2020-08-30 NOTE — Patient Instructions (Addendum)

## 2021-11-29 IMAGING — CT CT HEART MORP W/ CTA COR W/ SCORE W/ CA W/CM &/OR W/O CM
4 of 7 series · 8 of 20 positions shown, 9 images · IV contrast (APPLIED)
Comparison: None.

Addendum:
CLINICAL DATA: Chest pain

EXAM:
Cardiac CTA
MEDICATIONS:
Sub lingual nitro. 4mg and lopressor 100mg oral
TECHNIQUE: The patient was scanned on a Siemens Force [REDACTED]ice scanner. Gantry
rotation speed was 250 msecs. Collimation was .6 mm. A 100 kV
prospective scan was triggered in the ascending thoracic aorta at
140 HU's Full mA was used between 35% and 75% of the R-R interval.
Average HR during the scan was 70 bpm. The 3D data set was
interpreted on a dedicated work station using MPR, MIP and VRT
modes. A total of 80cc of contrast was used.

[Series 6: best diast 70 % · axial · 0.43mm/px · z∈[-221,-174]mm · 2 of 356 slices shown]
[im 119/356  vessel]
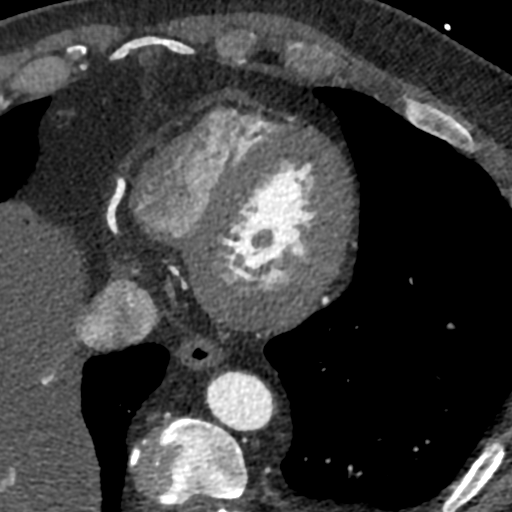
[im 237/356  vessel]
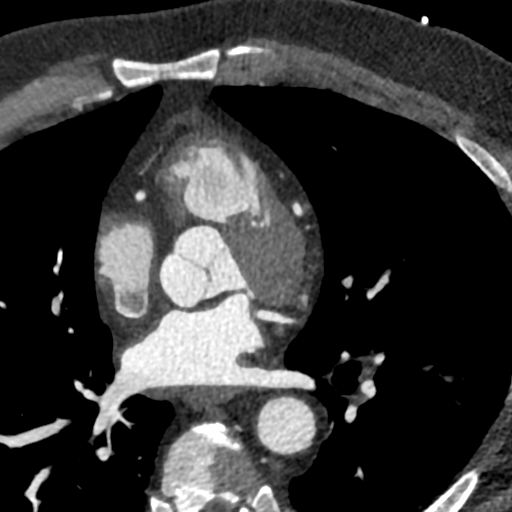

[Series 7: best syst · axial · 0.43mm/px · z∈[-221,-174]mm · 2 of 356 slices shown, 3 images]
[im 119/356  vessel]
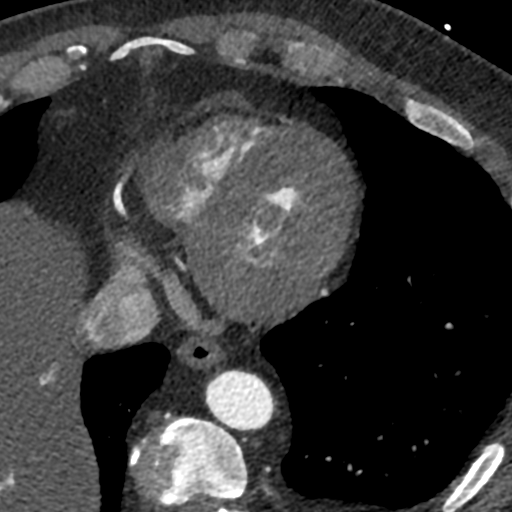
[im 119/356  lung]
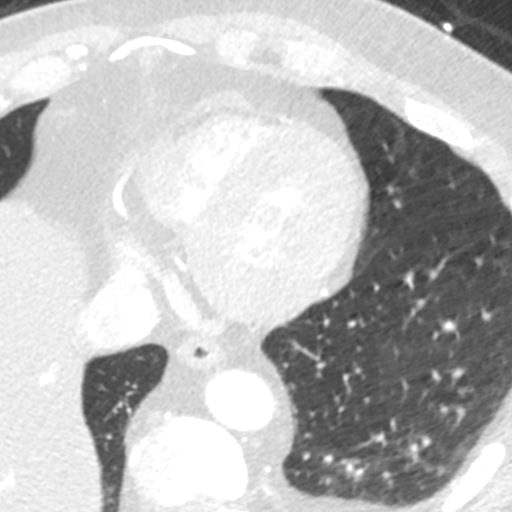
[im 237/356  vessel]
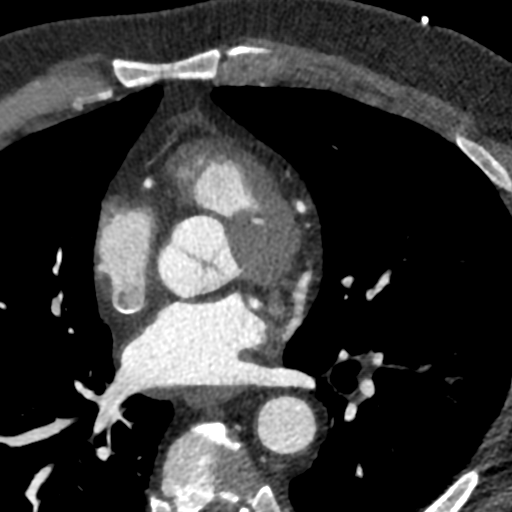

[Series 8: ts diast sharp 70 % · axial · 0.43mm/px · z∈[-221,-174]mm · 2 of 356 slices shown]
[im 119/356  lung]
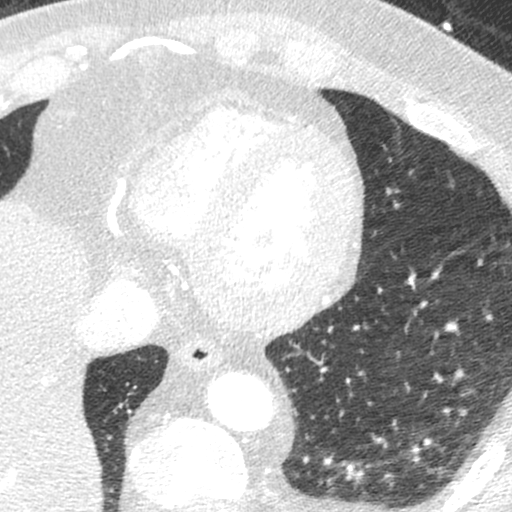
[im 237/356  lung]
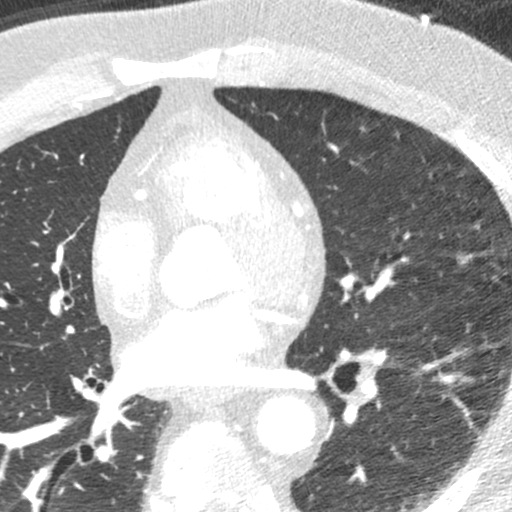

[Series 9: ts syst sharp · axial · 0.43mm/px · z∈[-221,-174]mm · 2 of 356 slices shown]
[im 119/356  lung]
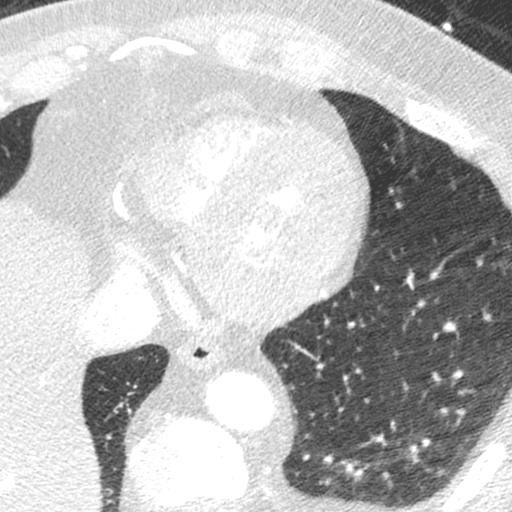
[im 237/356  lung]
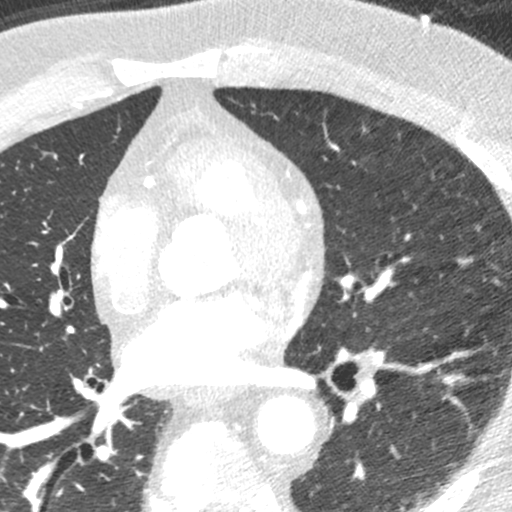

[8 of 20 positions shown; findings below may reference images not displayed]

FINDINGS: Non-cardiac: See separate report from [REDACTED]. No
significant findings on limited lung and soft tissue windows.

Calcium Score: Isolated calcium noted in proximal LAD

Coronary Arteries: Right dominant with no anomalies

LM: Normal

LAD: 1-24% mixed plaque in the proximal LAD

D1: Normal

D2: Normal

Circumflex: Normal

OM1: Normal

OM2: Normal

RCA: Normal

PDA: Normal

PLA: Normal
IMPRESSION: 1. Calcium score 1 which is 53 [REDACTED] percentile for age and sex

2.  Normal aortic root 3.5 cm

3.  CAD RADS 1 non obstructive CAD see description above

Sorin Oxendine

EXAM:
OVER-READ INTERPRETATION  CT CHEST

The following report is an over-read performed by radiologist Dr.
does not include interpretation of cardiac or coronary anatomy or
pathology. The coronary calcium score/coronary CTA interpretation by
the cardiologist is attached.
FINDINGS: The visualized portions of the lower lung fields show no suspicious
nodules, masses, or infiltrates. No pleural fluid seen.

The visualized portions of the mediastinum and chest wall are
unremarkable.
IMPRESSION: No significant non-cardiovascular abnormality seen in visualized
portion of the thorax.

*** End of Addendum ***
FINDINGS: Non-cardiac: See separate report from [REDACTED]. No
significant findings on limited lung and soft tissue windows.

Calcium Score: Isolated calcium noted in proximal LAD

Coronary Arteries: Right dominant with no anomalies

LM: Normal

LAD: 1-24% mixed plaque in the proximal LAD

D1: Normal

D2: Normal

Circumflex: Normal

OM1: Normal

OM2: Normal

RCA: Normal

PDA: Normal

PLA: Normal
IMPRESSION: 1. Calcium score 1 which is 53 [REDACTED] percentile for age and sex

2.  Normal aortic root 3.5 cm

3.  CAD RADS 1 non obstructive CAD see description above

Sorin Oxendine
